# Patient Record
Sex: Female | Born: 1970 | Race: Black or African American | Hispanic: No | Smoking: Former smoker
Health system: Southern US, Community
[De-identification: ages and names within clinical notes are randomized; demographics above are authoritative.]

## PROBLEM LIST (undated history)

## (undated) DIAGNOSIS — A6920 Lyme disease, unspecified: Secondary | ICD-10-CM

## (undated) DIAGNOSIS — D649 Anemia, unspecified: Secondary | ICD-10-CM

## (undated) DIAGNOSIS — E079 Disorder of thyroid, unspecified: Secondary | ICD-10-CM

## (undated) HISTORY — PX: THYROIDECTOMY: SHX17

## (undated) HISTORY — PX: MYOMECTOMY: SHX85

---

## 2017-01-16 ENCOUNTER — Emergency Department (HOSPITAL_COMMUNITY): Payer: Commercial Managed Care - PPO

## 2017-01-16 ENCOUNTER — Encounter (HOSPITAL_COMMUNITY): Payer: Self-pay

## 2017-01-16 ENCOUNTER — Observation Stay (HOSPITAL_COMMUNITY)
Admission: EM | Admit: 2017-01-16 | Discharge: 2017-01-17 | Disposition: A | Discharge status: Home / Self Care | Payer: Commercial Managed Care - PPO | Attending: Internal Medicine | Admitting: Internal Medicine

## 2017-01-16 DIAGNOSIS — I700 Atherosclerosis of aorta: Secondary | ICD-10-CM | POA: Insufficient documentation

## 2017-01-16 DIAGNOSIS — Z87891 Personal history of nicotine dependence: Secondary | ICD-10-CM | POA: Insufficient documentation

## 2017-01-16 DIAGNOSIS — R079 Chest pain, unspecified: Secondary | ICD-10-CM | POA: Diagnosis not present

## 2017-01-16 DIAGNOSIS — I951 Orthostatic hypotension: Secondary | ICD-10-CM | POA: Insufficient documentation

## 2017-01-16 DIAGNOSIS — E039 Hypothyroidism, unspecified: Secondary | ICD-10-CM | POA: Diagnosis not present

## 2017-01-16 DIAGNOSIS — D509 Iron deficiency anemia, unspecified: Principal | ICD-10-CM | POA: Insufficient documentation

## 2017-01-16 DIAGNOSIS — N920 Excessive and frequent menstruation with regular cycle: Secondary | ICD-10-CM | POA: Insufficient documentation

## 2017-01-16 DIAGNOSIS — D649 Anemia, unspecified: Secondary | ICD-10-CM

## 2017-01-16 DIAGNOSIS — Z79899 Other long term (current) drug therapy: Secondary | ICD-10-CM | POA: Insufficient documentation

## 2017-01-16 DIAGNOSIS — E890 Postprocedural hypothyroidism: Secondary | ICD-10-CM | POA: Diagnosis not present

## 2017-01-16 DIAGNOSIS — D259 Leiomyoma of uterus, unspecified: Secondary | ICD-10-CM | POA: Diagnosis not present

## 2017-01-16 HISTORY — DX: Anemia, unspecified: D64.9

## 2017-01-16 HISTORY — DX: Disorder of thyroid, unspecified: E07.9

## 2017-01-16 LAB — CBC
HEMATOCRIT: 23 % — AB (ref 36.0–46.0)
Hemoglobin: 6.1 g/dL — CL (ref 12.0–15.0)
MCH: 14.6 pg — ABNORMAL LOW (ref 26.0–34.0)
MCHC: 26.5 g/dL — AB (ref 30.0–36.0)
MCV: 55.2 fL — AB (ref 78.0–100.0)
PLATELETS: 406 10*3/uL — AB (ref 150–400)
RBC: 4.17 MIL/uL (ref 3.87–5.11)
RDW: 21.8 % — AB (ref 11.5–15.5)
WBC: 6.4 10*3/uL (ref 4.0–10.5)

## 2017-01-16 LAB — BASIC METABOLIC PANEL
Anion gap: 7 (ref 5–15)
BUN: 6 mg/dL (ref 6–20)
CHLORIDE: 105 mmol/L (ref 101–111)
CO2: 24 mmol/L (ref 22–32)
CREATININE: 0.74 mg/dL (ref 0.44–1.00)
Calcium: 9.1 mg/dL (ref 8.9–10.3)
GFR calc Af Amer: >60 mL/min (ref >60)
GFR calc non Af Amer: >60 mL/min (ref >60)
GLUCOSE: 106 mg/dL — AB (ref 65–99)
POTASSIUM: 3.8 mmol/L (ref 3.5–5.1)
Sodium: 136 mmol/L (ref 135–145)

## 2017-01-16 LAB — PREPARE RBC (CROSSMATCH)

## 2017-01-16 LAB — I-STAT TROPONIN, ED: Troponin i, poc: 0 ng/mL (ref 0.00–0.08)

## 2017-01-16 LAB — ABO/RH: ABO/RH(D): O POS

## 2017-01-16 MED ORDER — ACETAMINOPHEN 650 MG RE SUPP: 650.0000 mg | Freq: Four times a day (QID) | RECTAL | Status: DC | PRN

## 2017-01-16 MED ORDER — ACETAMINOPHEN 325 MG PO TABS: 650.0000 mg | ORAL_TABLET | Freq: Four times a day (QID) | ORAL | Status: DC | PRN

## 2017-01-16 MED ORDER — ONDANSETRON HCL 4 MG/2ML IJ SOLN: 4.0000 mg | Freq: Four times a day (QID) | INTRAMUSCULAR | Status: DC | PRN

## 2017-01-16 MED ORDER — SENNA 8.6 MG PO TABS
1.0000 | ORAL_TABLET | Freq: Two times a day (BID) | ORAL | Status: DC
  Administered 2017-01-17: 8.6 mg via ORAL
  Filled 2017-01-16 (×2): qty 1

## 2017-01-16 MED ORDER — ADULT MULTIVITAMIN W/MINERALS CH
1.0000 | ORAL_TABLET | Freq: Every day | ORAL | Status: DC
  Administered 2017-01-16 – 2017-01-17 (×2): 1 via ORAL
  Filled 2017-01-16 (×2): qty 1

## 2017-01-16 MED ORDER — SODIUM CHLORIDE 0.9 % IV SOLN
10.0000 mL/h | Freq: Once | INTRAVENOUS | Status: AC
  Administered 2017-01-16: 10 mL/h via INTRAVENOUS

## 2017-01-16 MED ORDER — ENOXAPARIN SODIUM 40 MG/0.4ML ~~LOC~~ SOLN
40.0000 mg | SUBCUTANEOUS | Status: DC
  Administered 2017-01-16: 40 mg via SUBCUTANEOUS
  Filled 2017-01-16: qty 0.4

## 2017-01-16 MED ORDER — ONDANSETRON HCL 4 MG PO TABS: 4.0000 mg | ORAL_TABLET | Freq: Four times a day (QID) | ORAL | Status: DC | PRN

## 2017-01-16 NOTE — H&P (Addendum)
History and Physical    Neena Delone [id number] DOB: [birth year]  DOA: 01/16/2017 PCP: Pcp Not In System  Patient coming from: Home   Chief Complaint: SOB, chest pain   HPI: Trinady Parrillo is a 46 y.o. female with medical history significant of anemia due to uterine fibroids. Patient presented to the emergency room complaining of chest pain, and shortness of breath. She reports her symptoms started yesterday when she started feeling more tired than usual. Associated symptoms are lightheadedness/dizziness and fatigue. Patient's stated that she has similar symptoms in the past and has multiple transfusions. She was on iron infusion therapy, but has not has one since August 2017. Patient report heavy periods status post myomectomy, but heavy menstruation has persisted. Denies gross bleeding including nosebleeds, rectal bleeding or dark stools. No abdominal pain, nausea vomiting or diarrhea.  ED Course: Hgb 6.1, positive orthostatics. due to chest pain and dizziness TR[facility] was asked to admit the patient for observation.   Review of Systems:   General:  see history of present illness  HEENT: no blurry vision, hearing changes or sore throat Respiratory: no coughing or wheezing CV: No palpitations. Positive for chest pain  GI: no nausea, vomiting, abdominal pain, diarrhea, constipation GU: no dysuria, burning on urination, increased urinary frequency, hematuria  Ext:. No deformities,  Neuro: no unilateral weakness, numbness, or tingling, no vision change or hearing loss Skin: No rashes, lesions or wounds. MSK: No muscle spasm, no deformity, no limitation of range of movement in spin Heme: No easy bruising.  Travel history: No recent long distant travel.   Past Medical History:  Diagnosis Date  . Anemia   . Thyroid disease     Past Surgical History:  Procedure Laterality Date  . MYOMECTOMY    . THYROIDECTOMY       reports that she has quit smoking. She has never used smokeless  tobacco. She reports that she drinks alcohol. She reports that she does not use drugs.  No Known Allergies  History reviewed. No pertinent family history.  Prior to Admission medications   Medication Sig Start Date End Date Taking? Authorizing Provider  Multiple Vitamin (MULTIVITAMIN WITH MINERALS) TABS tablet Take 1 tablet by mouth daily.   Yes Historical Provider, MD  naproxen sodium (ANAPROX) 220 MG tablet Take 220 mg by mouth 2 (two) times daily with a meal.   Yes Historical Provider, MD    Physical Exam: Vitals:   01/16/17 0938 01/16/17 1203 01/16/17 1301  BP: 134/78 126/60 127/73  Pulse: 72 83 99  Resp: 16 15 20   Temp: 98.4 F (36.9 C)    SpO2: 100% 100% 100%     Constitutional: NAD, calm, comfortable Eyes: PERRL, lids and conjunctivae pale  ENMT: Mucous membranes are moist. Posterior pharynx clear  Neck: normal, supple, no masses Respiratory: clear to auscultation bilaterally, no wheezing, no crackles.  Cardiovascular: Regular rate and rhythm, no murmurs / rubs / gallops. No extremity edema. 2+ pedal pulses. Abdomen: no tenderness, no masses palpated. No hepatosplenomegaly. Bowel sounds positive.  Musculoskeletal: no clubbing / cyanosis.  Skin: no rashes, lesions, ulcers.  Neurologic: CN 2-12 grossly intact.  Psychiatric: Normal judgment and insight. Alert and oriented x 3. Normal mood.    Labs on Admission: I have personally reviewed following labs and imaging studies  CBC:  Recent Labs Lab 01/16/17 1210  WBC 6.4  HGB 6.1*  HCT 23.0*  MCV 55.2*  PLT [id number]*   Basic Metabolic Panel:  Recent Labs Lab 01/16/17 1210  NA  136  K 3.8  CL 105  CO2 24  GLUCOSE 106*  BUN 6  CREATININE 0.74  CALCIUM 9.1   Radiological Exams on Admission: Dg Chest 2 View  Result Date: 01/16/2017 CLINICAL DATA:  New onset of mid chest pain this morning. Former smoker. EXAM: CHEST  2 VIEW COMPARISON:  None in PACs FINDINGS: The lungs are well-expanded and clear. The heart and  pulmonary vascularity are normal. The mediastinum is normal in width. There is faint calcification in the wall of the aortic arch. The bony thorax exhibits no acute abnormality. IMPRESSION: There is no pneumonia nor other acute cardiopulmonary abnormality. Thoracic aortic atherosclerosis. Electronically Signed   By: David  Martinique M.D.   On: 01/16/2017 10:01    EKG: Independently reviewed. NSR  Assessment/Plan Symptomatic anemia - chronic hx of iron deficiency anemia.  Admit to tele given symptoms will transfuse and observe overnight to assure resolution. Transfuse 2 units - monitor for signs of fluid overload.  Iron supplement - consider Iron infusion before discharge, per patient oral iron does not work for her. Anemia panel pending.   Orthostatic hypotension due to anemia  See Above   Chest pain likely 2/2 to demand ischemia.  Check troponin and EKG  Monitor telemetry   Hypothyroidism - not on medication due to lack of insurance. Was on Synthroid 75 mcg. Clinically euthyroid  Will check TSH - will resume synthroid pending results   DVT prophylaxis: Lovenox  Code Status: FULL  Family Communication: Daughter at bedside Disposition Plan: Anticipate discharge to previous home environment.  Consults called: None  Admission status: Tele    Chipper Oman MD [facility] Pager: Text Page via [email or web address]  [phone]  If 7PM-7AM, please contact night-coverage [email or web address] Password [email or web address]  01/16/2017, 1:32 PM

## 2017-01-16 NOTE — ED Notes (Signed)
Phlebotomy rounded to the bedside. It was reported back to this RN that the patient is drinking water and was told that someone would come back by and look with ultrasound.

## 2017-01-16 NOTE — ED Notes (Signed)
Critical lab verbally reported to Dr. Ashok Cordia.

## 2017-01-16 NOTE — ED Notes (Signed)
Attempted ultrasound and lab draw, no success. Second RN to assess.

## 2017-01-16 NOTE — ED Notes (Signed)
This RN at bedside in order attempt lab draw and peripheral line.

## 2017-01-16 NOTE — ED Notes (Signed)
Lab draw attempted x2 unsuccessful will report off

## 2017-01-16 NOTE — ED Triage Notes (Signed)
Pt presents with c/o central sharp chest pain that started approx 5 days ago. Pt reports that she is anemic and has chest pain when she has issues with her anemia. Pt reports she has been admitted several times last year with her anemia to receive blood transfusions.

## 2017-01-16 NOTE — ED Notes (Signed)
ED Provider at bedside. 

## 2017-01-16 NOTE — ED Notes (Signed)
Jana Half, charge RN went to attempt ultrasound IV and labs, however, pt stating is requesting everything to be done with one stick only. IV team consult order in place.

## 2017-01-16 NOTE — ED Notes (Signed)
Patient transported to X-ray 

## 2017-01-16 NOTE — ED Provider Notes (Signed)
[facility] DEPT Provider Note   CSN: [id number] Arrival date & time: 01/16/17  [id number]     History   Chief Complaint Chief Complaint  Patient presents with  . Chest Pain    HPI Veronica Bennett is a 46 y.o. female.  Patient with hx anemia, presents c/o feeling generally tired, fatigued, and had transient mid chest pain w exertion, and faintness/lightheadedness when stands - states has similar symptoms in past when blood count gets too low.  Hx transfusions in past. . Long hx anemia. Was on iron therapy iv, but due to insurance issues, no iron therapy x 3-4 months. States hx fibroids and heavy period - prior myomectomy,but states heavy periods persisted. Last period a couple weeks ago. No current bleeding. No rectal bleeding or melena. Denies sob. No abd pain. No nv.    The history is provided by the patient.  Chest Pain   Associated symptoms include weakness. Pertinent negatives include no abdominal pain, no back pain, no fever, no headaches and no shortness of breath.    Past Medical History:  Diagnosis Date  . Anemia   . Thyroid disease     There are no active problems to display for this patient.   Past Surgical History:  Procedure Laterality Date  . MYOMECTOMY    . THYROIDECTOMY      OB History    No data available       Home Medications    Prior to Admission medications   Medication Sig Start Date End Date Taking? Authorizing Provider  Multiple Vitamin (MULTIVITAMIN WITH MINERALS) TABS tablet Take 1 tablet by mouth daily.   Yes Historical Provider, MD  naproxen sodium (ANAPROX) 220 MG tablet Take 220 mg by mouth 2 (two) times daily with a meal.   Yes Historical Provider, MD    Family History No family history on file.  Social History Social History  Substance Use Topics  . Smoking status: Former Research scientist (life sciences)  . Smokeless tobacco: Never Used  . Alcohol use Yes     Comment: occasionally      Allergies   Patient has no known allergies.   Review of  Systems Review of Systems  Constitutional: Negative for fever.  HENT: Negative for sore throat.   Eyes: Negative for redness.  Respiratory: Negative for shortness of breath.   Cardiovascular: Positive for chest pain.  Gastrointestinal: Negative for abdominal pain.  Genitourinary: Negative for flank pain.  Musculoskeletal: Negative for back pain and neck pain.  Skin: Negative for rash.  Neurological: Positive for weakness and light-headedness. Negative for headaches.  Hematological: Does not bruise/bleed easily.  Psychiatric/Behavioral: Negative for confusion.     Physical Exam Updated Vital Signs BP 134/78   Pulse 72   Temp 98.4 F (36.9 C)   Resp 16   LMP 12/21/2016 (Approximate)   SpO2 100%   Physical Exam  Constitutional: She appears well-developed and well-nourished. No distress.  HENT:  Head: Atraumatic.  Eyes: No scleral icterus.  Pale conj  Neck: Neck supple. No tracheal deviation present.  Cardiovascular: Normal rate, regular rhythm, normal heart sounds and intact distal pulses.  Exam reveals no gallop and no friction rub.   No murmur heard. Pulmonary/Chest: Effort normal and breath sounds normal. No respiratory distress.  Abdominal: Soft. Normal appearance and bowel sounds are normal. She exhibits no distension. There is no tenderness.  Musculoskeletal: She exhibits no edema.  Neurological: She is alert.  Skin: Skin is warm and dry. No rash noted. There is pallor.  Psychiatric: She has a normal mood and affect.  Nursing note and vitals reviewed.    ED Treatments / Results  Labs (all labs ordered are listed, but only abnormal results are displayed) Results for orders placed or performed during the hospital encounter of 01/16/17  CBC  Result Value Ref Range   WBC 6.4 4.0 - 10.5 K/uL   RBC 4.17 3.87 - 5.11 MIL/uL   Hemoglobin 6.1 (LL) 12.0 - 15.0 g/dL   HCT 23.0 (L) 36.0 - 46.0 %   MCV 55.2 (L) 78.0 - 100.0 fL   MCH 14.6 (L) 26.0 - 34.0 pg   MCHC 26.5  (L) 30.0 - 36.0 g/dL   RDW 21.8 (H) 11.5 - 15.5 %   Platelets 406 (H) 150 - 400 K/uL  I-stat troponin, ED  Result Value Ref Range   Troponin i, poc 0.00 0.00 - 0.08 ng/mL   Comment 3           Dg Chest 2 View  Result Date: 01/16/2017 CLINICAL DATA:  New onset of mid chest pain this morning. Former smoker. EXAM: CHEST  2 VIEW COMPARISON:  None in PACs FINDINGS: The lungs are well-expanded and clear. The heart and pulmonary vascularity are normal. The mediastinum is normal in width. There is faint calcification in the wall of the aortic arch. The bony thorax exhibits no acute abnormality. IMPRESSION: There is no pneumonia nor other acute cardiopulmonary abnormality. Thoracic aortic atherosclerosis. Electronically Signed   By: David  Martinique M.D.   On: 01/16/2017 10:01    EKG  EKG Interpretation  Date/Time:  Tuesday January 16 2017 09:42:19 EST Ventricular Rate:  73 PR Interval:    QRS Duration: 80 QT Interval:  395 QTC Calculation: 436 R Axis:   14 Text Interpretation:  Sinus rhythm No previous tracing Confirmed by Ashok Cordia  MD, Lennette Bihari ([id number]) on 01/16/2017 10:14:30 AM       Radiology Dg Chest 2 View  Result Date: 01/16/2017 CLINICAL DATA:  New onset of mid chest pain this morning. Former smoker. EXAM: CHEST  2 VIEW COMPARISON:  None in PACs FINDINGS: The lungs are well-expanded and clear. The heart and pulmonary vascularity are normal. The mediastinum is normal in width. There is faint calcification in the wall of the aortic arch. The bony thorax exhibits no acute abnormality. IMPRESSION: There is no pneumonia nor other acute cardiopulmonary abnormality. Thoracic aortic atherosclerosis. Electronically Signed   By: David  Martinique M.D.   On: 01/16/2017 10:01    Procedures Procedures (including critical care time)  Medications Ordered in ED Medications - No data to display   Initial Impression / Assessment and Plan / ED Course  I have reviewed the triage vital signs and the nursing  notes.  Pertinent labs & imaging results that were available during my care of the patient were reviewed by me and considered in my medical decision making (see chart for details).  Labs sent.   hgb 6. Patient symptomatic w lightheadedness/near syncope w standing, chest pain/sob w minimal exertion, weak/fatigued.  Will transfuse.  Hospitalist consulted for admission/obs stay.   Final Clinical Impressions(s) / ED Diagnoses   Final diagnoses:  None    New Prescriptions New Prescriptions   No medications on file     Lajean Saver, MD 01/16/17 1246

## 2017-01-16 NOTE — ED Notes (Signed)
Floor notified that pt is forthcoming.

## 2017-01-17 DIAGNOSIS — D649 Anemia, unspecified: Secondary | ICD-10-CM | POA: Diagnosis not present

## 2017-01-17 LAB — TYPE AND SCREEN
ABO/RH(D): O POS
ANTIBODY SCREEN: POSITIVE
DAT, IGG: NEGATIVE
Donor AG Type: NEGATIVE
Donor AG Type: NEGATIVE
PT AG Type: NEGATIVE
UNIT DIVISION: 0
Unit division: 0

## 2017-01-17 LAB — CBC
HEMATOCRIT: 25.4 % — AB (ref 36.0–46.0)
Hemoglobin: 7.4 g/dL — ABNORMAL LOW (ref 12.0–15.0)
MCH: 17.5 pg — ABNORMAL LOW (ref 26.0–34.0)
MCHC: 29.1 g/dL — AB (ref 30.0–36.0)
MCV: 60.2 fL — ABNORMAL LOW (ref 78.0–100.0)
Platelets: 393 10*3/uL (ref 150–400)
RBC: 4.22 MIL/uL (ref 3.87–5.11)
RDW: 26.8 % — AB (ref 11.5–15.5)
WBC: 5.2 10*3/uL (ref 4.0–10.5)

## 2017-01-17 LAB — BASIC METABOLIC PANEL
Anion gap: 6 (ref 5–15)
BUN: 8 mg/dL (ref 6–20)
CO2: 23 mmol/L (ref 22–32)
Calcium: 8.7 mg/dL — ABNORMAL LOW (ref 8.9–10.3)
Chloride: 108 mmol/L (ref 101–111)
Creatinine, Ser: 0.67 mg/dL (ref 0.44–1.00)
GFR calc Af Amer: >60 mL/min (ref >60)
GFR calc non Af Amer: >60 mL/min (ref >60)
GLUCOSE: 104 mg/dL — AB (ref 65–99)
POTASSIUM: 3.7 mmol/L (ref 3.5–5.1)
Sodium: 137 mmol/L (ref 135–145)

## 2017-01-17 MED ORDER — LEVOTHYROXINE SODIUM 75 MCG PO TABS: 75.0000 ug | ORAL_TABLET | Freq: Every day | ORAL | 3 refills | Status: DC

## 2017-01-17 MED ORDER — SODIUM CHLORIDE 0.9 % IV SOLN
250.0000 mg | Freq: Once | INTRAVENOUS | Status: AC
  Administered 2017-01-17: 250 mg via INTRAVENOUS
  Filled 2017-01-17: qty 20

## 2017-01-17 NOTE — Discharge Summary (Signed)
Physician Discharge Summary  Veronica Bennett [id number] DOB: [birth year] DOA: 01/16/2017  PCP: Pcp Not In System  Admit date: 01/16/2017 Discharge date: 01/17/2017  Admitted From: Home Disposition: Home  Recommendations for Outpatient Follow-up:  1. Follow up with PCP in 1-2 weeks 2. Please obtain BMP/CBC in one week.  Home Health: NA Equipment/Devices:NA  Discharge Condition: Stable CODE STATUS: Full Code Diet recommendation: Diet regular Room service appropriate? Yes; Fluid consistency: Thin Diet - low sodium heart healthy  Brief/Interim Summary: Veronica Bennett is a 46 y.o. female with medical history significant of anemia due to uterine fibroids. Patient presented to the emergency room complaining of chest pain, and shortness of breath. She reports her symptoms started yesterday when she started feeling more tired than usual. Associated symptoms are lightheadedness/dizziness and fatigue. Patient's stated that she has similar symptoms in the past and has multiple transfusions. She was on iron infusion therapy, but has not has one since August 2017. Patient report heavy periods status post myomectomy, but heavy menstruation has persisted. Denies gross bleeding including nosebleeds, rectal bleeding or dark stools. No abdominal pain, nausea vomiting or diarrhea  Discharge Diagnoses:  Active Problems:   Symptomatic anemia   Symptomatic anemia, Microcytic anemia likely iron deficiency anemia  -Patient presented with hemoglobin of 6.1, she has chest tightness and shortness of breath. -Reported chronic anemia secondary to menorrhagia. -Status post transfusion of 2 units of packed RBC, hemoglobin improved to 7.4. -Given 250 mg of Nulecit. -Feels better, discharged to follow-up with PCP for referral to hematology in gynecology.  Menorrhagia (AUB) -Patient reported has chronic abnormal uterine bleeding, seen by gynecologist before. -Recommendation was for either hysterectomy or ablation,  she did not have any. -Continues to have a 10 day period which she have to change her pad hourly. -I highly recommended for her to follow-up with gynecologist for further recommendation.  Orthostatic hypotension due to anemia  See Above   Chest pain likely 2/2 to demand ischemia.  Troponin and ECG negative for ischemic changes, this is resolved, likely secondary to anemia.  Hypothyroidism  -She was on Synthroid 75 g, she was not taking it secondary to insurance problems. -This is restarted, please check TSH in 4-6 weeks.   Discharge Instructions  Discharge Instructions    Diet - low sodium heart healthy    Complete by:  As directed    Increase activity slowly    Complete by:  As directed      Allergies as of 01/17/2017   No Known Allergies     Medication List    STOP taking these medications   naproxen sodium 220 MG tablet Commonly known as:  ANAPROX     TAKE these medications   levothyroxine 75 MCG tablet Commonly known as:  SYNTHROID, LEVOTHROID Take 1 tablet (75 mcg total) by mouth daily before breakfast.   multivitamin with minerals Tabs tablet Take 1 tablet by mouth daily.       No Known Allergies  Consultations:   None  Procedures (Echo, Carotid, EGD, Colonoscopy, ERCP)   Radiological studies: Dg Chest 2 View  Result Date: 01/16/2017 CLINICAL DATA:  New onset of mid chest pain this morning. Former smoker. EXAM: CHEST  2 VIEW COMPARISON:  None in PACs FINDINGS: The lungs are well-expanded and clear. The heart and pulmonary vascularity are normal. The mediastinum is normal in width. There is faint calcification in the wall of the aortic arch. The bony thorax exhibits no acute abnormality. IMPRESSION: There is no pneumonia nor other acute  cardiopulmonary abnormality. Thoracic aortic atherosclerosis. Electronically Signed   By: David  Martinique M.D.   On: 01/16/2017 10:01     Subjective:  Discharge Exam: Vitals:   01/16/17 2343 01/17/17 0215  01/17/17 0516 01/17/17 1005  BP: 118/65 (!) 116/54 (!) 117/52 125/76  Pulse: 83 74 72 61  Resp: 16 16 16    Temp: 98.6 F (37 C) 98.9 F (37.2 C) 98.4 F (36.9 C)   TempSrc: Oral Oral Oral   SpO2: 100% 100% 100%   Weight:      Height:       General: Pt is alert, awake, not in acute distress Cardiovascular: RRR, S1/S2 +, no rubs, no gallops Respiratory: CTA bilaterally, no wheezing, no rhonchi Abdominal: Soft, NT, ND, bowel sounds + Extremities: no edema, no cyanosis   The results of significant diagnostics from this hospitalization (including imaging, microbiology, ancillary and laboratory) are listed below for reference.    Microbiology: No results found for this or any previous visit (from the past 240 hour(s)).   Labs: BNP (last 3 results) No results for input(s): BNP in the last 8760 hours. Basic Metabolic Panel:  Recent Labs Lab 01/16/17 1210 01/17/17 0809  NA 136 137  K 3.8 3.7  CL 105 108  CO2 24 23  GLUCOSE 106* 104*  BUN 6 8  CREATININE 0.74 0.67  CALCIUM 9.1 8.7*   Liver Function Tests: No results for input(s): AST, ALT, ALKPHOS, BILITOT, PROT, ALBUMIN in the last 168 hours. No results for input(s): LIPASE, AMYLASE in the last 168 hours. No results for input(s): AMMONIA in the last 168 hours. CBC:  Recent Labs Lab 01/16/17 1210 01/17/17 0809  WBC 6.4 5.2  HGB 6.1* 7.4*  HCT 23.0* 25.4*  MCV 55.2* 60.2*  PLT 406* 393   Cardiac Enzymes: No results for input(s): CKTOTAL, CKMB, CKMBINDEX, TROPONINI in the last 168 hours. BNP: Invalid input(s): POCBNP CBG: No results for input(s): GLUCAP in the last 168 hours. D-Dimer No results for input(s): DDIMER in the last 72 hours. Hgb A1c No results for input(s): HGBA1C in the last 72 hours. Lipid Profile No results for input(s): CHOL, HDL, LDLCALC, TRIG, CHOLHDL, LDLDIRECT in the last 72 hours. Thyroid function studies No results for input(s): TSH, T4TOTAL, T3FREE, THYROIDAB in the last 72  hours.  Invalid input(s): FREET3 Anemia work up No results for input(s): VITAMINB12, FOLATE, FERRITIN, TIBC, IRON, RETICCTPCT in the last 72 hours. Urinalysis No results found for: COLORURINE, APPEARANCEUR, LABSPEC, [place], GLUCOSEU, HGBUR, BILIRUBINUR, KETONESUR, PROTEINUR, UROBILINOGEN, NITRITE, LEUKOCYTESUR Sepsis Labs Invalid input(s): PROCALCITONIN,  WBC,  LACTICIDVEN Microbiology No results found for this or any previous visit (from the past 240 hour(s)).   Time coordinating discharge: Over 30 minutes  SIGNED:   Birdie Hopes, MD  [facility] 01/17/2017, 11:41 AM Pager   If 7PM-7AM, please contact night-coverage [email or web address] Password [email or web address]

## 2017-01-17 NOTE — Progress Notes (Signed)
Pt refused labs to be drawn at 5am, NP on-call paged with update. Modified orders so labs will be drawn at 8am. Will continue to monitor.

## 2017-01-22 ENCOUNTER — Other Ambulatory Visit: Payer: Self-pay | Admitting: Family

## 2017-01-22 DIAGNOSIS — Z1231 Encounter for screening mammogram for malignant neoplasm of breast: Secondary | ICD-10-CM

## 2017-02-15 ENCOUNTER — Other Ambulatory Visit: Payer: Self-pay | Admitting: Obstetrics & Gynecology

## 2017-02-15 DIAGNOSIS — R928 Other abnormal and inconclusive findings on diagnostic imaging of breast: Secondary | ICD-10-CM

## 2017-02-20 ENCOUNTER — Ambulatory Visit
Admission: RE | Admit: 2017-02-20 | Discharge: 2017-02-20 | Disposition: A | Discharge status: Home / Self Care | Payer: Commercial Managed Care - PPO | Source: Ambulatory Visit | Attending: Obstetrics & Gynecology | Admitting: Obstetrics & Gynecology

## 2017-02-20 DIAGNOSIS — R928 Other abnormal and inconclusive findings on diagnostic imaging of breast: Secondary | ICD-10-CM

## 2017-04-04 ENCOUNTER — Telehealth: Payer: Self-pay | Admitting: Hematology

## 2017-04-04 ENCOUNTER — Encounter: Payer: Self-pay | Admitting: Hematology

## 2017-04-04 NOTE — Telephone Encounter (Signed)
Appt has been scheduled for the pt to see Dr. Irene Limbo on 5/24 at [place] a letter to the referring office and requesting labs

## 2017-04-26 ENCOUNTER — Encounter: Payer: Commercial Managed Care - PPO | Admitting: Hematology

## 2017-08-31 ENCOUNTER — Other Ambulatory Visit: Payer: Self-pay | Admitting: Obstetrics & Gynecology

## 2017-08-31 DIAGNOSIS — N63 Unspecified lump in unspecified breast: Secondary | ICD-10-CM

## 2017-10-04 ENCOUNTER — Observation Stay (HOSPITAL_COMMUNITY)
Admission: EM | Admit: 2017-10-04 | Discharge: 2017-10-05 | Disposition: A | Discharge status: Home / Self Care | Payer: Self-pay | Attending: Internal Medicine | Admitting: Internal Medicine

## 2017-10-04 ENCOUNTER — Encounter: Payer: Self-pay | Admitting: Emergency Medicine

## 2017-10-04 ENCOUNTER — Emergency Department (HOSPITAL_COMMUNITY): Payer: Self-pay

## 2017-10-04 DIAGNOSIS — N920 Excessive and frequent menstruation with regular cycle: Secondary | ICD-10-CM | POA: Diagnosis present

## 2017-10-04 DIAGNOSIS — Z87891 Personal history of nicotine dependence: Secondary | ICD-10-CM | POA: Insufficient documentation

## 2017-10-04 DIAGNOSIS — E039 Hypothyroidism, unspecified: Secondary | ICD-10-CM | POA: Insufficient documentation

## 2017-10-04 DIAGNOSIS — Z79899 Other long term (current) drug therapy: Secondary | ICD-10-CM | POA: Insufficient documentation

## 2017-10-04 DIAGNOSIS — R519 Headache, unspecified: Secondary | ICD-10-CM

## 2017-10-04 DIAGNOSIS — D649 Anemia, unspecified: Secondary | ICD-10-CM | POA: Insufficient documentation

## 2017-10-04 DIAGNOSIS — R51 Headache: Principal | ICD-10-CM | POA: Insufficient documentation

## 2017-10-04 LAB — CBC WITH DIFFERENTIAL/PLATELET
BASOS ABS: 0.1 10*3/uL (ref 0.0–0.1)
BASOS PCT: 1 %
EOS ABS: 0.1 10*3/uL (ref 0.0–0.7)
Eosinophils Relative: 1 %
HCT: 19.6 % — ABNORMAL LOW (ref 36.0–46.0)
Hemoglobin: 5 g/dL — CL (ref 12.0–15.0)
LYMPHS PCT: 22 %
Lymphs Abs: 1.7 10*3/uL (ref 0.7–4.0)
MCH: 14.3 pg — AB (ref 26.0–34.0)
MCHC: 25.5 g/dL — ABNORMAL LOW (ref 30.0–36.0)
MCV: 56.2 fL — ABNORMAL LOW (ref 78.0–100.0)
MONO ABS: 0.4 10*3/uL (ref 0.1–1.0)
Monocytes Relative: 5 %
NEUTROS PCT: 71 %
Neutro Abs: 5.2 10*3/uL (ref 1.7–7.7)
PLATELETS: 500 10*3/uL — AB (ref 150–400)
RBC: 3.49 MIL/uL — ABNORMAL LOW (ref 3.87–5.11)
RDW: 21.8 % — ABNORMAL HIGH (ref 11.5–15.5)
WBC: 7.5 10*3/uL (ref 4.0–10.5)

## 2017-10-04 LAB — BASIC METABOLIC PANEL
ANION GAP: 10 (ref 5–15)
BUN: 6 mg/dL (ref 6–20)
CALCIUM: 8.9 mg/dL (ref 8.9–10.3)
CO2: 23 mmol/L (ref 22–32)
CREATININE: 0.65 mg/dL (ref 0.44–1.00)
Chloride: 104 mmol/L (ref 101–111)
Glucose, Bld: 97 mg/dL (ref 65–99)
Potassium: 3.7 mmol/L (ref 3.5–5.1)
SODIUM: 137 mmol/L (ref 135–145)

## 2017-10-04 LAB — URINALYSIS, ROUTINE W REFLEX MICROSCOPIC
BILIRUBIN URINE: NEGATIVE
Glucose, UA: NEGATIVE mg/dL
HGB URINE DIPSTICK: NEGATIVE
Ketones, ur: NEGATIVE mg/dL
Leukocytes, UA: NEGATIVE
NITRITE: NEGATIVE
PROTEIN: NEGATIVE mg/dL
Specific Gravity, Urine: 1.004 — ABNORMAL LOW (ref 1.005–1.030)
pH: 6 (ref 5.0–8.0)

## 2017-10-04 MED ORDER — SODIUM CHLORIDE 0.9 % IV SOLN
Freq: Once | INTRAVENOUS | Status: AC
  Administered 2017-10-05: 03:00:00 via INTRAVENOUS

## 2017-10-04 NOTE — ED Triage Notes (Signed)
Per pt: Hx of anemia. Pt reports getting iron infusions and blood transfusions for her anemia, but has been unable to keep up the tx due to her loss in healthcare coveragge. Pt reports a splitting headache.  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

## 2017-10-04 NOTE — ED Notes (Signed)
CRITICAL VALUE STICKER  CRITICAL VALUE: Hgb 5.0  RECEIVER (on-site recipient of call): Ethelene Browns RN  Horace NOTIFIED: 11/1, 2149  MESSENGER (representative from lab): Ulice Dash  MD NOTIFIED: Tamera Punt MD  TIME OF NOTIFICATION: 2150  RESPONSE: see orders

## 2017-10-04 NOTE — ED Provider Notes (Signed)
[facility] DEPT Provider Note   CSN: [id number] Arrival date & time: 10/04/17  1803     History   Chief Complaint Chief Complaint  Patient presents with  . Headache  . Anemia    HPI Veronica Bennett is a 46 y.o. female.  Patient is a 46 year old female who presents with 2 complaints.  She has a history of anemia and she was previously getting iron transfusions and blood transfusions.  Her anemia is related to heavy periods.  She has subsequently lost her insurance and has not gotten any infusions since June.  She feels tired and weak but no chest pain or shortness of breath other than some baseline shortness of breath.  She also states she has had a 4-day history of a severe headache.  On the left forehead area and started behind her left ear.  She states is been fairly constant for 4 days but will go away with certain positions or massaging of the area.  She denies any current neck pain.  She has had some nausea but no vomiting.  No vision changes.  No numbness or weakness to her extremities.  No speech deficits.  She is concerned because her mother died suddenly at age 22 of a ruptured aneurysm.      Past Medical History:  Diagnosis Date  . Anemia   . Thyroid disease     Patient Active Problem List   Diagnosis Date Noted  . Anemia 10/04/2017  . Symptomatic anemia 01/16/2017  . Chest pain   . Hypothyroidism     Past Surgical History:  Procedure Laterality Date  . MYOMECTOMY    . THYROIDECTOMY      OB History    No data available       Home Medications    Prior to Admission medications   Medication Sig Start Date End Date Taking? Authorizing Provider  B Complex Vitamins (VITAMIN B COMPLEX PO) Take 1 tablet by mouth daily.   Yes [provider]  cholecalciferol (VITAMIN D) 1000 units tablet Take 1,000 Units by mouth daily.   Yes [provider]  ELDERBERRY PO Take 1,200 mg by mouth daily.   Yes [provider]  levothyroxine (SYNTHROID, LEVOTHROID) 75 MCG tablet Take 1 tablet (75 mcg total) by mouth daily before breakfast. 01/17/17  Yes Elmahi, Mutaz, MD  LYSINE HCL PO Take 2 capsules by mouth daily.   Yes [provider]  Oil of Oregano 1500 MG CAPS Take 1,500 mg by mouth daily.   Yes [provider]    Family History No family history on file.  Social History Social History  Substance Use Topics  . Smoking status: Former Research scientist (life sciences)  . Smokeless tobacco: Never Used  . Alcohol use Yes     Comment: occasionally      Allergies   Patient has no known allergies.   Review of Systems Review of Systems  Constitutional: Positive for fatigue. Negative for chills, diaphoresis and fever.  HENT: Negative for congestion, rhinorrhea and sneezing.   Eyes: Negative.   Respiratory: Positive for shortness of breath. Negative for cough and chest tightness.   Cardiovascular: Negative for chest pain and leg swelling.  Gastrointestinal: Negative for abdominal pain, blood in stool, diarrhea, nausea and vomiting.  Genitourinary: Negative for difficulty urinating, flank pain, frequency and hematuria.  Musculoskeletal: Negative for arthralgias and back pain.  Skin: Negative for rash.  Neurological: Positive for weakness and headaches. Negative for dizziness, speech difficulty and numbness.  Physical Exam Updated Vital Signs BP 133/71 (BP Location: Right Arm)   Pulse 78   Temp 98.3 F (36.8 C) (Oral)   Resp 20   Ht 5\' 3"  (1.6 m)   Wt 112.9 kg (249 lb)   LMP 09/04/2017   SpO2 98%   BMI 44.11 kg/m   Physical Exam  Constitutional: She is oriented to person, place, and time. She appears well-developed and well-nourished.  HENT:  Head: Normocephalic and atraumatic.  Left Ear: External ear normal.  No pain over the temporal artery  Eyes: Pupils are equal, round, and reactive to light.  Neck: Normal range of motion. Neck supple.  No meningismus  Cardiovascular:  Normal rate, regular rhythm and normal heart sounds.   Pulmonary/Chest: Effort normal and breath sounds normal. No respiratory distress. She has no wheezes. She has no rales. She exhibits no tenderness.  Abdominal: Soft. Bowel sounds are normal. There is no tenderness. There is no rebound and no guarding.  Musculoskeletal: Normal range of motion. She exhibits no edema.  Lymphadenopathy:    She has no cervical adenopathy.  Neurological: She is alert and oriented to person, place, and time.  Motor 5/5 all extremities Sensation grossly intact to LT all extremities Finger to Nose intact, no pronator drift CN II-XII grossly intact Gait normal   Skin: Skin is warm and dry. No rash noted.  Psychiatric: She has a normal mood and affect.     ED Treatments / Results  Labs (all labs ordered are listed, but only abnormal results are displayed) Labs Reviewed  CBC WITH DIFFERENTIAL/PLATELET - Abnormal; Notable for the following:       Result Value   RBC 3.49 (*)    Hemoglobin 5.0 (*)    HCT 19.6 (*)    MCV 56.2 (*)    MCH 14.3 (*)    MCHC 25.5 (*)    RDW 21.8 (*)    Platelets 500 (*)    All other components within normal limits  URINALYSIS, ROUTINE W REFLEX MICROSCOPIC - Abnormal; Notable for the following:    APPearance HAZY (*)    Specific Gravity, Urine 1.004 (*)    All other components within normal limits  BASIC METABOLIC PANEL  I-STAT BETA HCG BLOOD, ED ([facility], [facility], [facility] ONLY)  PREPARE RBC (CROSSMATCH)    EKG  EKG Interpretation None       Radiology Ct Head Wo Contrast  Result Date: 10/04/2017 CLINICAL DATA:  Headache EXAM: CT HEAD WITHOUT CONTRAST TECHNIQUE: Contiguous axial images were obtained from the base of the skull through the vertex without intravenous contrast. COMPARISON:  None. FINDINGS: Brain: No evidence of acute infarction, hemorrhage, hydrocephalus, extra-axial collection or mass lesion/mass effect. Vascular: No hyperdense vessel or unexpected calcification.  Skull: Normal. Negative for fracture or focal lesion. Sinuses/Orbits: No acute finding. Other: None IMPRESSION: No acute intracranial abnormality. Electronically Signed   By: Ashley Royalty M.D.   On: 10/04/2017 20:59    Procedures Procedures (including critical care time)  Medications Ordered in ED Medications  0.9 %  sodium chloride infusion (not administered)     Initial Impression / Assessment and Plan / ED Course  I have reviewed the triage vital signs and the nursing notes.  Pertinent labs & imaging results that were available during my care of the patient were reviewed by me and considered in my medical decision making (see chart for details).     Patient is a 46 year old female with a history of chronic anemia who presents with  increased weakness.  She has not been able to get her iron transfusions.  Her hemoglobin today is 5.  She was typed and crossed for 2 units of packed red cells.  I consult to Dr. Hal Hope who will admit the pt. she also reports a headache that was worse than her typical migrainous type headaches and different in character.  She has a history of a mom who died of an aneurysm.  CT scan is negative.  Her headache is currently resolved.  Final Clinical Impressions(s) / ED Diagnoses   Final diagnoses:  Symptomatic anemia  Bad headache    New Prescriptions New Prescriptions   No medications on file     Malvin Johns, MD 10/04/17 2327

## 2017-10-05 ENCOUNTER — Encounter (HOSPITAL_COMMUNITY): Payer: Self-pay

## 2017-10-05 DIAGNOSIS — D649 Anemia, unspecified: Secondary | ICD-10-CM

## 2017-10-05 DIAGNOSIS — R519 Headache, unspecified: Secondary | ICD-10-CM

## 2017-10-05 DIAGNOSIS — E039 Hypothyroidism, unspecified: Secondary | ICD-10-CM

## 2017-10-05 DIAGNOSIS — N920 Excessive and frequent menstruation with regular cycle: Secondary | ICD-10-CM

## 2017-10-05 DIAGNOSIS — R51 Headache: Principal | ICD-10-CM

## 2017-10-05 LAB — CBC
HEMATOCRIT: 25.7 % — AB (ref 36.0–46.0)
Hemoglobin: 7.3 g/dL — ABNORMAL LOW (ref 12.0–15.0)
MCH: 17.7 pg — ABNORMAL LOW (ref 26.0–34.0)
MCHC: 28.4 g/dL — ABNORMAL LOW (ref 30.0–36.0)
MCV: 62.4 fL — AB (ref 78.0–100.0)
Platelets: 465 10*3/uL — ABNORMAL HIGH (ref 150–400)
RBC: 4.12 MIL/uL (ref 3.87–5.11)
RDW: 27.8 % — AB (ref 11.5–15.5)
WBC: 5 10*3/uL (ref 4.0–10.5)

## 2017-10-05 LAB — BASIC METABOLIC PANEL
Anion gap: 8 (ref 5–15)
BUN: 5 mg/dL — AB (ref 6–20)
CHLORIDE: 104 mmol/L (ref 101–111)
CO2: 23 mmol/L (ref 22–32)
Calcium: 8.6 mg/dL — ABNORMAL LOW (ref 8.9–10.3)
Creatinine, Ser: 0.66 mg/dL (ref 0.44–1.00)
GFR calc Af Amer: >60 mL/min (ref >60)
GFR calc non Af Amer: >60 mL/min (ref >60)
Glucose, Bld: 88 mg/dL (ref 65–99)
POTASSIUM: 3.7 mmol/L (ref 3.5–5.1)
SODIUM: 135 mmol/L (ref 135–145)

## 2017-10-05 LAB — PREPARE RBC (CROSSMATCH)

## 2017-10-05 MED ORDER — ONDANSETRON HCL 4 MG/2ML IJ SOLN: 4.0000 mg | Freq: Four times a day (QID) | INTRAMUSCULAR | Status: DC | PRN

## 2017-10-05 MED ORDER — TRAMADOL HCL 50 MG PO TABS
50.0000 mg | ORAL_TABLET | Freq: Once | ORAL | Status: AC | PRN
  Administered 2017-10-05: 50 mg via ORAL
  Filled 2017-10-05: qty 1

## 2017-10-05 MED ORDER — ONDANSETRON HCL 4 MG PO TABS: 4.0000 mg | ORAL_TABLET | Freq: Four times a day (QID) | ORAL | Status: DC | PRN

## 2017-10-05 MED ORDER — LEVOTHYROXINE SODIUM 50 MCG PO TABS
75.0000 ug | ORAL_TABLET | Freq: Every day | ORAL | Status: DC
  Administered 2017-10-05: 09:00:00 75 ug via ORAL
  Filled 2017-10-05 (×2): qty 1

## 2017-10-05 MED ORDER — ACETAMINOPHEN 650 MG RE SUPP: 650.0000 mg | Freq: Four times a day (QID) | RECTAL | Status: DC | PRN

## 2017-10-05 MED ORDER — ACETAMINOPHEN 325 MG PO TABS
650.0000 mg | ORAL_TABLET | Freq: Four times a day (QID) | ORAL | Status: DC | PRN
  Administered 2017-10-05: 650 mg via ORAL
  Filled 2017-10-05: qty 2

## 2017-10-05 MED ORDER — POLYSACCHARIDE IRON COMPLEX 150 MG PO CAPS: 150.0000 mg | ORAL_CAPSULE | Freq: Every day | ORAL | 1 refills | Status: DC

## 2017-10-05 MED ORDER — LEVOTHYROXINE SODIUM 75 MCG PO TABS: 75.0000 ug | ORAL_TABLET | Freq: Every day | ORAL | 1 refills | Status: AC

## 2017-10-05 NOTE — Discharge Summary (Signed)
Physician Discharge Summary  Veronica Bennett JOA:[id number] DOB: 11-Sep-1971 DOA: 10/04/2017  PCP: Patient, No Pcp Per  Admit date: 10/04/2017 Discharge date: 10/05/2017  Time spent: 60 minutes  Recommendations for Outpatient Follow-up:  1. Follow-up with her PCP in the next 1-2 weeks.On follow-up patient will need a CBC done as well as an anemia panel done to follow-up on her H&H as well as iron and ferritin levels. Patient will also need thyroid function studies done with further management of her Synthroid. 2. Follow-up with OB/GYN in the next 1-2 weeks for follow-up on menorrhagia.   Discharge Diagnoses:  Principal Problem:   Symptomatic anemia Active Problems:   Hypothyroidism   Anemia   Menorrhagia   Bad headache   Discharge Condition: Stable and improved  Diet recommendation: Regular  Filed Weights   10/04/17 1830  Weight: 112.9 kg (249 lb)    History of present illness:  Per dr Lavetta Nielsen is a 46 y.o. female with known history of iron deficiency anemia secondary to menorrhagia who had moved from [place] last year and had not followed up to the physician in [place] presented with weakness and headache.  Patient stated in [place] she used to get weekly iron infusion for iron deficiency anemia secondary to menorrhagia.  She stated her periods are heavy and at this time she was not in her menses.  Patient felt weak on exertion denied any chest pain or shortness of breath.  Had been having headache off and on every month but this time the headache had become more intense and constant over the last 4 days mostly in the left temporal side pounding in nature.  Had some nausea denied any vomiting or any focal deficits or visual symptoms.  ED Course: Since patient's mother has history of cerebral aneurysm CT head was done which was negative.  Patient appears nonfocal.  Hemoglobin was 5 patient blood pressure was in the low normal.  Patient was admitted for  symptomatic anemia and headache.  2 units of PRBC transfusion has been ordered.  The patient denies taking any NSAIDs or having any rectal bleeding or has had not vomited any blood.   Hospital Course:  # 1 Severe symptomatic anemia likely secondary to menorrhagia and iron deficiency anemia Patient was admitted with symptomatic anemia with complaints of weakness and headache. Patient noted to have a hemoglobin of 5 on admission. Patient denied any overt bleeding. Patient stated has a history of iron deficiency anemia and is on IV iron and unable to take oral iron pills due to poor absorption. Patient was admitted typed and crossed and transfused 2 units packed red blood cells. Patient noted to have a MCV of 62.4. Posttransfusion CBC obtained showed an increase of her hemoglobin appropriately to 7.3. Patient improved clinically and requested to be discharged. As patient was in stable condition patient be discharged home in stable and improved condition and information was given to patient about PCP and OB/GYN to follow-up upon with.  #2 hypothyroidism Patient noted to have a history of hypothyroidism. Patient noted to be on Synthroid 75 MCG's daily. Patient remained stable. Patient be discharged home on home regimen of Synthroid. At time of discharge patient noted to nurse that she needed a prescription for his Synthroid which was written for patient. Patient is to pick a PCP and all follow-up will likely need thyroid function studies done.  #3 headache Felt likely secondary to migraine headaches likely exacerbated by anemia. CT head which was done was  negative for any acute abnormalities. Patient was transfused and given some Tylenol with improvement with her headaches. Outpatient follow-up. A  #4 history of menorrhagia Patient stated she was previously told to take oral contraceptive pills however hasn't done so due to concerns of side effects. Patient also with prior history of fibroidectomy. Patient  was given information in order to follow-up with OB/GYN in the outpatient setting.  Procedures:  Transfusion 2 units packed red blood cells 10/05/2017  CT head 10/04/2017  Consultations:  None  Discharge Exam: Vitals:   10/05/17 0845 10/05/17 1242  BP: (!) 110/54 (!) 123/59  Pulse: 62 65  Resp: 16 16  Temp: 98.2 F (36.8 C) 98.4 F (36.9 C)  SpO2: 100% 100%    General: NAD Cardiovascular: RRR Respiratory: CTAB  Discharge Instructions   Discharge Instructions    Diet general    Complete by:  As directed    Increase activity slowly    Complete by:  As directed      Current Discharge Medication List    CONTINUE these medications which have CHANGED   Details  levothyroxine (SYNTHROID, LEVOTHROID) 75 MCG tablet Take 1 tablet (75 mcg total) by mouth daily before breakfast. Qty: 30 tablet, Refills: 1      CONTINUE these medications which have NOT CHANGED   Details  B Complex Vitamins (VITAMIN B COMPLEX PO) Take 1 tablet by mouth daily.    cholecalciferol (VITAMIN D) 1000 units tablet Take 1,000 Units by mouth daily.    ELDERBERRY PO Take 1,200 mg by mouth daily.    LYSINE HCL PO Take 2 capsules by mouth daily.    Oil of Oregano 1500 MG CAPS Take 1,500 mg by mouth daily.       No Known Allergies Follow-up Information    PCP. Schedule an appointment as soon as possible for a visit in 1 week(s).   Why:  F/U IN 1-2 WEEKS.       OBGYN. Schedule an appointment as soon as possible for a visit in 2 week(s).            The results of significant diagnostics from this hospitalization (including imaging, microbiology, ancillary and laboratory) are listed below for reference.    Significant Diagnostic Studies: Ct Head Wo Contrast  Result Date: 10/04/2017 CLINICAL DATA:  Headache EXAM: CT HEAD WITHOUT CONTRAST TECHNIQUE: Contiguous axial images were obtained from the base of the skull through the vertex without intravenous contrast. COMPARISON:  None.  FINDINGS: Brain: No evidence of acute infarction, hemorrhage, hydrocephalus, extra-axial collection or mass lesion/mass effect. Vascular: No hyperdense vessel or unexpected calcification. Skull: Normal. Negative for fracture or focal lesion. Sinuses/Orbits: No acute finding. Other: None IMPRESSION: No acute intracranial abnormality. Electronically Signed   By: Ashley Royalty M.D.   On: 10/04/2017 20:59    Microbiology: No results found for this or any previous visit (from the past 240 hour(s)).   Labs: Basic Metabolic Panel:  Recent Labs Lab 10/04/17 2130 10/05/17 1237  NA 137 135  K 3.7 3.7  CL 104 104  CO2 23 23  GLUCOSE 97 88  BUN 6 5*  CREATININE 0.65 0.66  CALCIUM 8.9 8.6*   Liver Function Tests: No results for input(s): AST, ALT, ALKPHOS, BILITOT, PROT, ALBUMIN in the last 168 hours. No results for input(s): LIPASE, AMYLASE in the last 168 hours. No results for input(s): AMMONIA in the last 168 hours. CBC:  Recent Labs Lab 10/04/17 2130 10/05/17 1237  WBC 7.5 5.0  NEUTROABS  5.2  --   HGB 5.0* 7.3*  HCT 19.6* 25.7*  MCV 56.2* 62.4*  PLT 500* 465*   Cardiac Enzymes: No results for input(s): CKTOTAL, CKMB, CKMBINDEX, TROPONINI in the last 168 hours. BNP: BNP (last 3 results) No results for input(s): BNP in the last 8760 hours.  ProBNP (last 3 results) No results for input(s): PROBNP in the last 8760 hours.  CBG: No results for input(s): GLUCAP in the last 168 hours.     SignedIrine Seal MD.  [facility] 10/05/2017, 3:46 PM

## 2017-10-05 NOTE — H&P (Signed)
History and Physical    Veronica Bennett ZGY:[id number] DOB: 11-11-1971 DOA: 10/04/2017  PCP: Patient, No Pcp Per  Patient coming from: Home.  Chief Complaint: Weakness and headache.  HPI: Veronica Bennett is a 46 y.o. female with known history of iron deficiency anemia secondary to menorrhagia who has moved from [place] last year and has not followed up to the physician in [place] presents with weakness and headache.  Patient states in [place] she used to get weekly iron infusion for iron deficiency anemia secondary to menorrhagia.  She states her periods are heavy and at this time she is not in her menses.  Patient feels weak on exertion denies any chest pain or shortness of breath.  Has been having headache off and on every month but this time the headache has become more intense and constant over the last 4 days mostly in the left temporal side pounding in nature.  Had some nausea denies any vomiting or any focal deficits or visual symptoms.  ED Course: Since patient's mother has history of cerebral aneurysm CT head was done which was negative.  Patient appears nonfocal.  Hemoglobin was 5 patient blood pressure was in the low normal.  Patient was admitted for symptomatic anemia and headache.  2 units of PRBC transfusion has been ordered.  The patient denies taking any NSAIDs or having any rectal bleeding or has had not vomited any blood.  Review of Systems: As per HPI, rest all negative.   Past Medical History:  Diagnosis Date  . Anemia   . Thyroid disease     Past Surgical History:  Procedure Laterality Date  . MYOMECTOMY    . THYROIDECTOMY       reports that she quit smoking about 28 years ago. She has never used smokeless tobacco. She reports that she drinks about 3.0 oz of alcohol per week . She reports that she does not use drugs.  No Known Allergies  Family History  Problem Relation Age of Onset  . Aneurysm Mother     Prior to Admission medications   Medication  Sig Start Date End Date Taking? Authorizing Provider  B Complex Vitamins (VITAMIN B COMPLEX PO) Take 1 tablet by mouth daily.   Yes [provider]  cholecalciferol (VITAMIN D) 1000 units tablet Take 1,000 Units by mouth daily.   Yes [provider]  ELDERBERRY PO Take 1,200 mg by mouth daily.   Yes [provider]  levothyroxine (SYNTHROID, LEVOTHROID) 75 MCG tablet Take 1 tablet (75 mcg total) by mouth daily before breakfast. 01/17/17  Yes Elmahi, Mutaz, MD  LYSINE HCL PO Take 2 capsules by mouth daily.   Yes [provider]  Oil of Oregano 1500 MG CAPS Take 1,500 mg by mouth daily.   Yes [provider]    Physical Exam: Vitals:   10/04/17 1830 10/04/17 2343 10/05/17 0030  BP: 133/71 125/65 111/68  Pulse: 78 75 82  Resp: 20 18 18   Temp: 98.3 F (36.8 C) 98.4 F (36.9 C) 98.5 F (36.9 C)  TempSrc: Oral Oral Oral  SpO2: 98% 98% 98%  Weight: 112.9 kg (249 lb)    Height: 5\' 3"  (1.6 m)        Constitutional: Moderately built and nourished. Vitals:   10/04/17 1830 10/04/17 2343 10/05/17 0030  BP: 133/71 125/65 111/68  Pulse: 78 75 82  Resp: 20 18 18   Temp: 98.3 F (36.8 C) 98.4 F (36.9 C) 98.5 F (36.9 C)  TempSrc:  Oral Oral Oral  SpO2: 98% 98% 98%  Weight: 112.9 kg (249 lb)    Height: 5\' 3"  (1.6 m)     Eyes: Anicteric mild pallor. ENMT: No discharge from the ears eyes nose or mouth. Neck: No mass felt.  No neck rigidity. Respiratory: No rhonchi or crepitations. Cardiovascular: S1-S2 heard no murmurs appreciated. Abdomen: Soft nontender bowel sounds present. Musculoskeletal: No edema.  No joint effusion. Skin: No rash.  Skin appears pale. Neurologic: Alert awake oriented to time place and person.  Moves all extremities. Psychiatric: Appears normal.  Normal affect.   Labs on Admission: I have personally reviewed following labs and imaging studies  CBC:  Recent Labs Lab 10/04/17 2130  WBC 7.5  NEUTROABS 5.2  HGB  5.0*  HCT 19.6*  MCV 56.2*  PLT 762*   Basic Metabolic Panel:  Recent Labs Lab 10/04/17 2130  NA 137  K 3.7  CL 104  CO2 23  GLUCOSE 97  BUN 6  CREATININE 0.65  CALCIUM 8.9   GFR: Estimated Creatinine Clearance: 104 mL/min (by C-G formula based on SCr of 0.65 mg/dL). Liver Function Tests: No results for input(s): AST, ALT, ALKPHOS, BILITOT, PROT, ALBUMIN in the last 168 hours. No results for input(s): LIPASE, AMYLASE in the last 168 hours. No results for input(s): AMMONIA in the last 168 hours. Coagulation Profile: No results for input(s): INR, PROTIME in the last 168 hours. Cardiac Enzymes: No results for input(s): CKTOTAL, CKMB, CKMBINDEX, TROPONINI in the last 168 hours. BNP (last 3 results) No results for input(s): PROBNP in the last 8760 hours. HbA1C: No results for input(s): HGBA1C in the last 72 hours. CBG: No results for input(s): GLUCAP in the last 168 hours. Lipid Profile: No results for input(s): CHOL, HDL, LDLCALC, TRIG, CHOLHDL, LDLDIRECT in the last 72 hours. Thyroid Function Tests: No results for input(s): TSH, T4TOTAL, FREET4, T3FREE, THYROIDAB in the last 72 hours. Anemia Panel: No results for input(s): VITAMINB12, FOLATE, FERRITIN, TIBC, IRON, RETICCTPCT in the last 72 hours. Urine analysis:    Component Value Date/Time   COLORURINE YELLOW 10/04/2017 1930   APPEARANCEUR HAZY (A) 10/04/2017 1930   LABSPEC 1.004 (L) 10/04/2017 1930   PHURINE 6.0 10/04/2017 1930   GLUCOSEU NEGATIVE 10/04/2017 1930   HGBUR NEGATIVE 10/04/2017 1930   BILIRUBINUR NEGATIVE 10/04/2017 1930   KETONESUR NEGATIVE 10/04/2017 1930   PROTEINUR NEGATIVE 10/04/2017 1930   NITRITE NEGATIVE 10/04/2017 1930   LEUKOCYTESUR NEGATIVE 10/04/2017 1930   Sepsis Labs: @LABRCNTIP (procalcitonin:4,lacticidven:4) )No results found for this or any previous visit (from the past 240 hour(s)).   Radiological Exams on Admission: Ct Head Wo Contrast  Result Date: 10/04/2017 CLINICAL DATA:   Headache EXAM: CT HEAD WITHOUT CONTRAST TECHNIQUE: Contiguous axial images were obtained from the base of the skull through the vertex without intravenous contrast. COMPARISON:  None. FINDINGS: Brain: No evidence of acute infarction, hemorrhage, hydrocephalus, extra-axial collection or mass lesion/mass effect. Vascular: No hyperdense vessel or unexpected calcification. Skull: Normal. Negative for fracture or focal lesion. Sinuses/Orbits: No acute finding. Other: None IMPRESSION: No acute intracranial abnormality. Electronically Signed   By: Ashley Royalty M.D.   On: 10/04/2017 20:59     Assessment/Plan Principal Problem:   Symptomatic anemia Active Problems:   Anemia    1. Severe symptomatic anemia -likely secondary to menorrhagia and iron deficiency.  2 units of PRBC transfusion has been ordered.  Recheck CBC after transfusion.  Patient probably will need to be scheduled with a PCP at discharge and will need  iron infusion since patient states he has not had good absorption with oral iron. 2. Headache likely from migraine which could have been exacerbated by the anemia -for now we have given Tylenol if it does not improve may consider NSAIDs.  I think patient headache may improve with transfusion. 3. Hypothyroidism on Synthroid. 4. Low normal blood pressure -recheck after transfusion. 5. History of menorrhagia will need follow-up with gynecologist.  Patient states she was previously told to take OC pills which she has not taken due to concerns for side effects.  Patient also has had previous fibroidectomy.   DVT prophylaxis: SCDs for now. Code Status: Full code. Family Communication: Discussed with patient. Disposition Plan: Home. Consults called: None. Admission status: Observation.   Rise Patience MD [facility] Pager [phone].  If 7PM-7AM, please contact night-coverage [email or web address] Password [email or web address]  10/05/2017, 2:09 AM

## 2017-10-05 NOTE — Progress Notes (Signed)
CM consult for PCP and OBGYN. This CM provided pt with [facility], [place] packet, info on [employer], [place] and [facility] Department. Pt appreciative of resources. Marney Doctor RN,BSN,NCM [phone]

## 2017-10-05 NOTE — Progress Notes (Signed)
Nutrition Brief Note  Patient identified on the Malnutrition Screening Tool (MST) Report  Wt Readings from Last 15 Encounters:  10/04/17 249 lb (112.9 kg)  01/16/17 250 lb 10.6 oz (113.7 kg)    Body mass index is 44.11 kg/m. Patient meets criteria for morbid obesity based on current BMI. Pt unaware of any unintentional weight loss. States her UBW stays around 250 lb.   Current diet order is regular. Pt reports having decrease in appetite over the last four days related to an ongoing headache. States she still consumes three meals per day but the quantity has decreased. She suspects her appetite will continue back to baseline once her headache goes away. Does not wish to have any supplementation at this times.   Labs and medications reviewed.   No nutrition interventions warranted at this time. If nutrition issues arise, please consult RD.   Mariana Single RD, LDN Clinical Nutrition Pager # [phone]

## 2017-10-08 LAB — TYPE AND SCREEN
ABO/RH(D): O POS
ANTIBODY SCREEN: POSITIVE
DAT, IGG: NEGATIVE
DONOR AG TYPE: NEGATIVE
DONOR AG TYPE: NEGATIVE
UNIT DIVISION: 0
Unit division: 0

## 2017-10-08 LAB — BPAM RBC
Blood Product Expiration Date: 201811292359
Blood Product Expiration Date: 201811302359
ISSUE DATE / TIME: 201811020253
ISSUE DATE / TIME: 201811020539
Unit Type and Rh: 5100
Unit Type and Rh: 5100

## 2018-03-26 ENCOUNTER — Emergency Department (HOSPITAL_COMMUNITY)
Admission: EM | Admit: 2018-03-26 | Discharge: 2018-03-26 | Discharge status: Against Medical Advice | Payer: Medicaid Other | Attending: Emergency Medicine | Admitting: Emergency Medicine

## 2018-03-26 ENCOUNTER — Emergency Department (HOSPITAL_COMMUNITY): Payer: Medicaid Other

## 2018-03-26 ENCOUNTER — Encounter (HOSPITAL_COMMUNITY): Payer: Self-pay

## 2018-03-26 ENCOUNTER — Other Ambulatory Visit: Payer: Self-pay

## 2018-03-26 DIAGNOSIS — Z5321 Procedure and treatment not carried out due to patient leaving prior to being seen by health care provider: Secondary | ICD-10-CM | POA: Diagnosis not present

## 2018-03-26 DIAGNOSIS — R0789 Other chest pain: Secondary | ICD-10-CM | POA: Diagnosis present

## 2018-03-26 HISTORY — DX: Lyme disease, unspecified: A69.20

## 2018-03-26 NOTE — ED Notes (Signed)
Unable to collect labs called patient name in the lobby and no one responded

## 2018-03-26 NOTE — ED Notes (Signed)
Per radiology technician, patient reports she is leaving.

## 2018-03-26 NOTE — ED Notes (Signed)
Pt called  No response from lobby  

## 2018-03-26 NOTE — ED Triage Notes (Addendum)
Patient c/o fatigue. Patient states she was admitted 11/18 for anemia and states when she was discharged her Hgb was 7.0 and is feeling very tired. Patient states she has heavy menstrual periods. Patient also c/o mid chest pain x 2 months. Patient states "My chest always hurts when I get down this low."

## 2019-03-06 IMAGING — CT CT HEAD W/O CM
3 of 4 series · 15 of 47 positions shown, 18 images · non-contrast
Comparison: None.

CLINICAL DATA: Headache

EXAM:
CT HEAD WITHOUT CONTRAST
TECHNIQUE: Contiguous axial images were obtained from the base of the skull
through the vertex without intravenous contrast.

[Series 2: head w/o · axial · non-contrast · 0.45mm/px · z∈[+1501,+1626]mm · 9 of 33 slices shown, 12 images]
[im 4/33  brain]
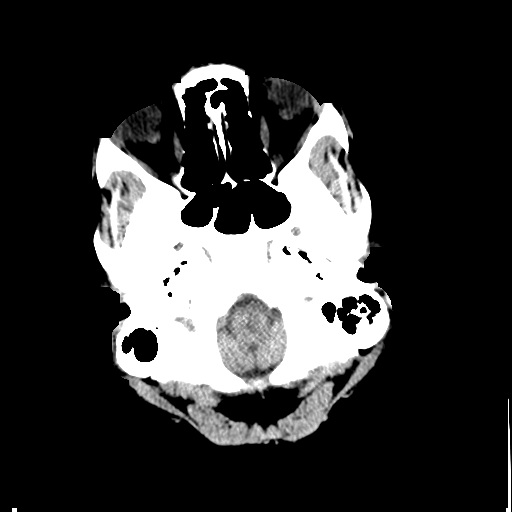
[im 4/33  bone]
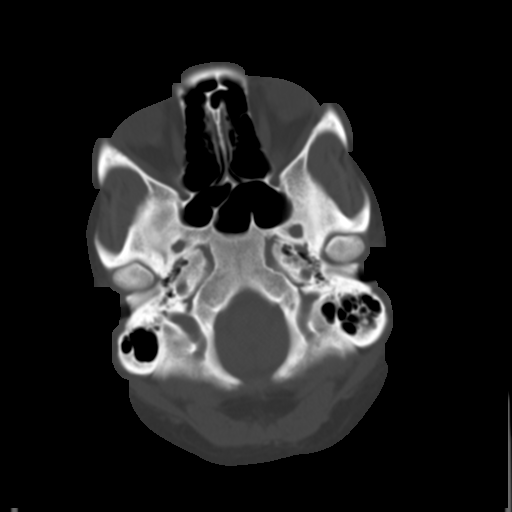
[im 7/33  brain]
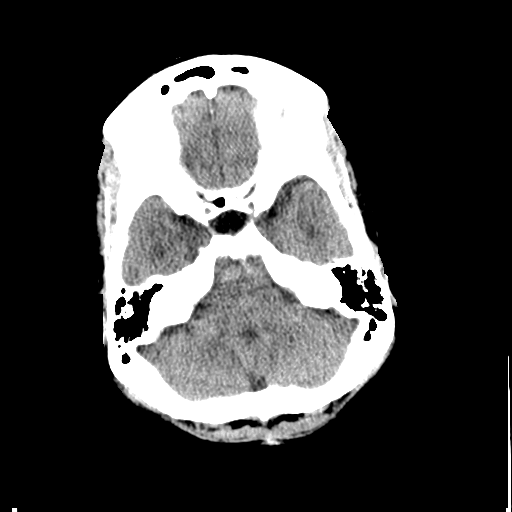
[im 10/33  brain]
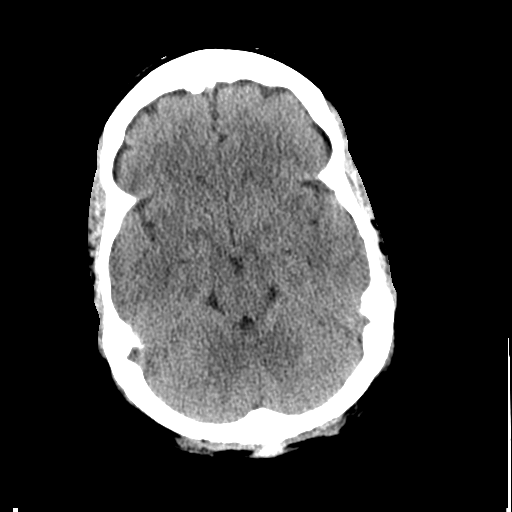
[im 13/33  brain]
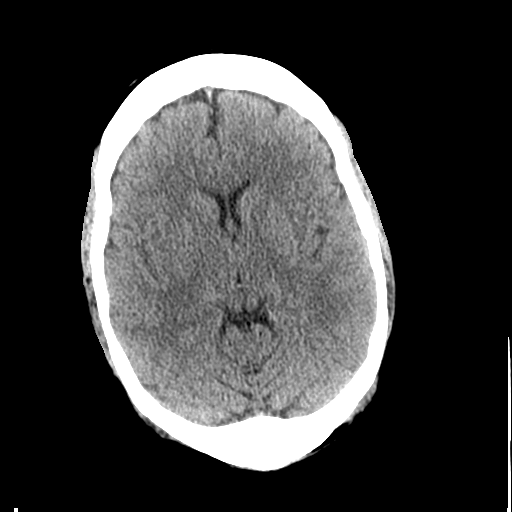
[im 17/33  brain]
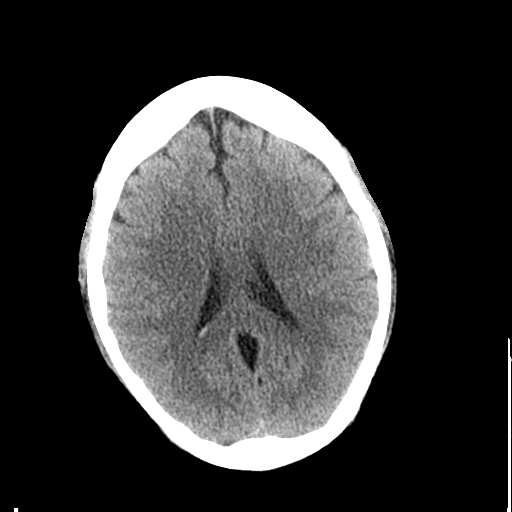
[im 17/33  bone]
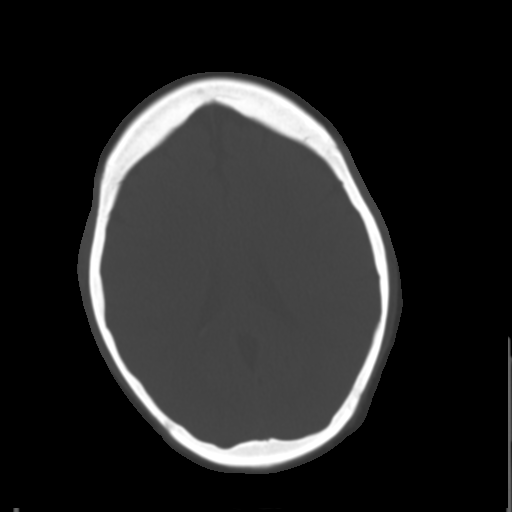
[im 20/33  brain]
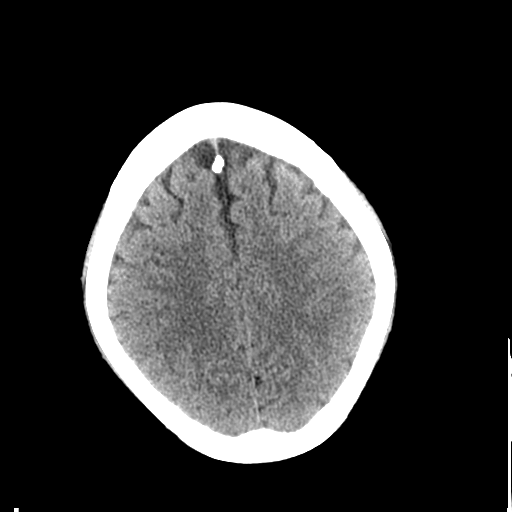
[im 23/33  brain]
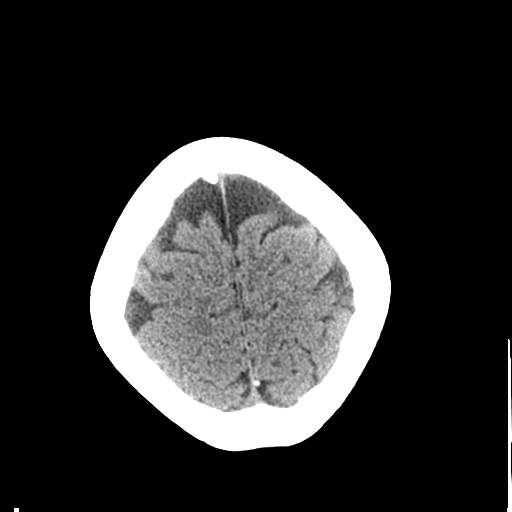
[im 26/33  brain]
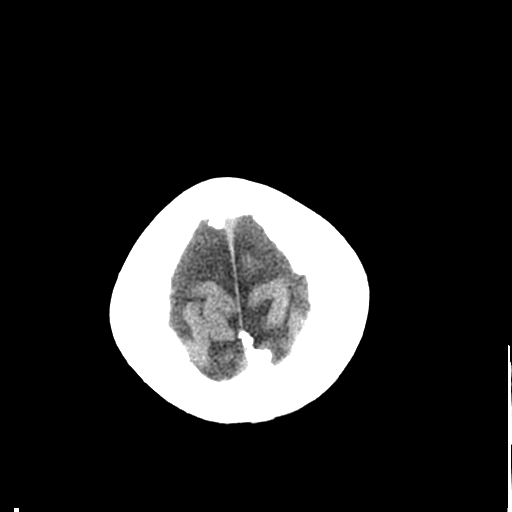
[im 29/33  brain]
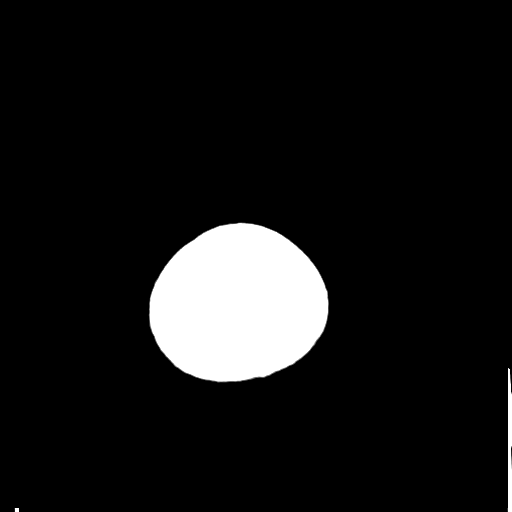
[im 29/33  bone]
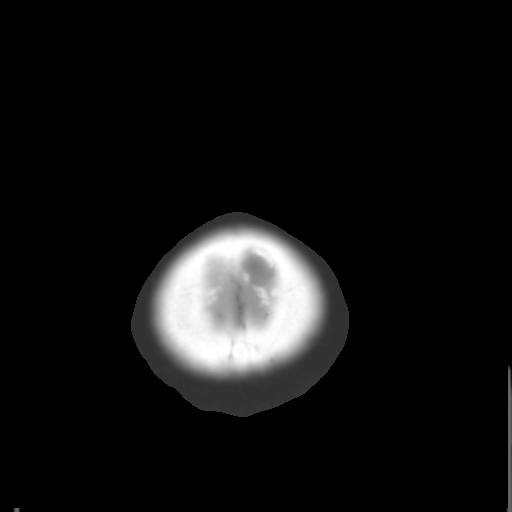

[Series 5: coronal · coronal · 0.31mm/px · 3 of 69 slices shown]
[im 23/69  brain]
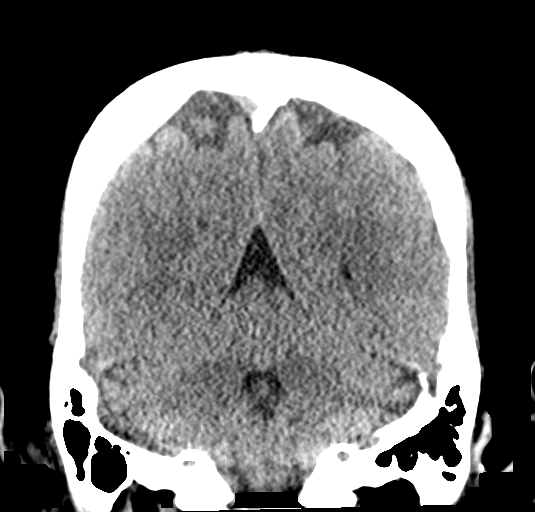
[im 31/69  brain]
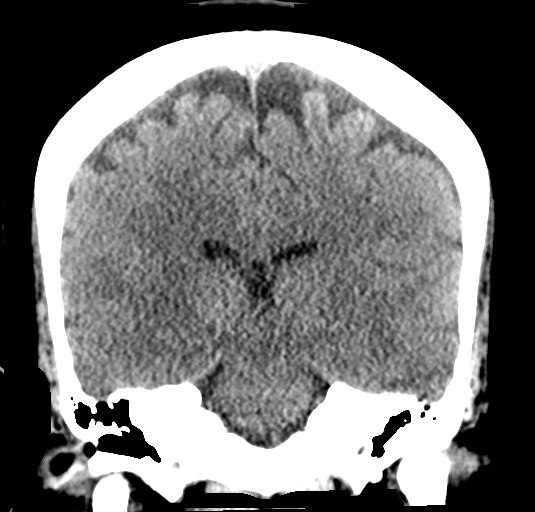
[im 38/69  brain]
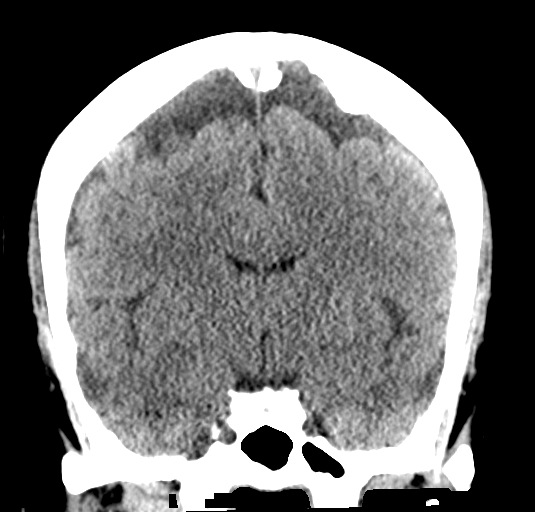

[Series 6: sagittal · sagittal · 0.31mm/px · 3 of 55 slices shown]
[im 19/55  brain]
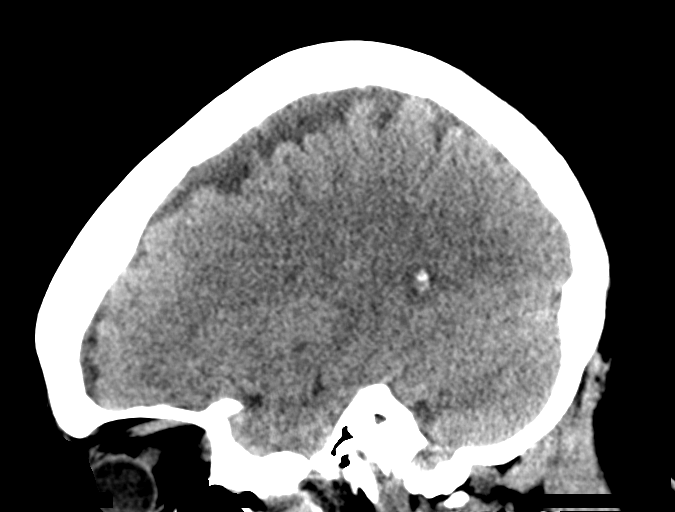
[im 28/55  brain]
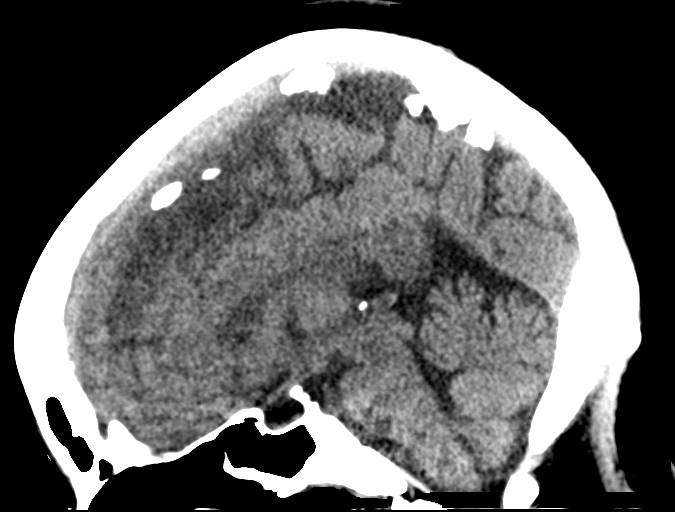
[im 37/55  brain]
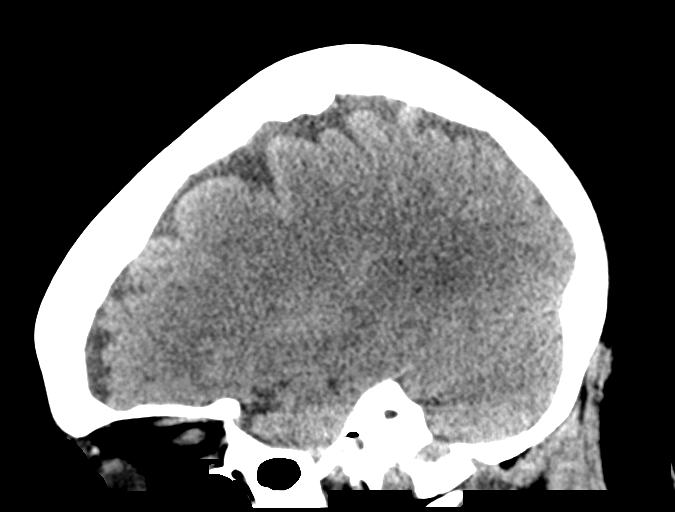

[15 of 47 positions shown; findings below may reference images not displayed]

FINDINGS: Brain: No evidence of acute infarction, hemorrhage, hydrocephalus,
extra-axial collection or mass lesion/mass effect.

Vascular: No hyperdense vessel or unexpected calcification.

Skull: Normal. Negative for fracture or focal lesion.

Sinuses/Orbits: No acute finding.

Other: None
IMPRESSION: No acute intracranial abnormality.
